# Patient Record
Sex: Female | Born: 1995 | Race: White | Hispanic: No | Marital: Single | Smoking: Never smoker
Health system: Southern US, Community
[De-identification: ages and names within clinical notes are randomized; demographics above are authoritative.]

## PROBLEM LIST (undated history)

## (undated) HISTORY — PX: KNEE SURGERY: SHX244

## (undated) HISTORY — PX: ANTERIOR CRUCIATE LIGAMENT REPAIR: SHX115

---

## 2017-09-28 ENCOUNTER — Emergency Department (HOSPITAL_BASED_OUTPATIENT_CLINIC_OR_DEPARTMENT_OTHER): Payer: Managed Care, Other (non HMO)

## 2017-09-28 ENCOUNTER — Encounter (HOSPITAL_BASED_OUTPATIENT_CLINIC_OR_DEPARTMENT_OTHER): Payer: Self-pay | Admitting: Emergency Medicine

## 2017-09-28 DIAGNOSIS — Y999 Unspecified external cause status: Secondary | ICD-10-CM | POA: Diagnosis not present

## 2017-09-28 DIAGNOSIS — Y929 Unspecified place or not applicable: Secondary | ICD-10-CM | POA: Insufficient documentation

## 2017-09-28 DIAGNOSIS — X509XXA Other and unspecified overexertion or strenuous movements or postures, initial encounter: Secondary | ICD-10-CM | POA: Diagnosis not present

## 2017-09-28 DIAGNOSIS — Y939 Activity, unspecified: Secondary | ICD-10-CM | POA: Insufficient documentation

## 2017-09-28 DIAGNOSIS — S39012A Strain of muscle, fascia and tendon of lower back, initial encounter: Secondary | ICD-10-CM | POA: Insufficient documentation

## 2017-09-28 DIAGNOSIS — S3992XA Unspecified injury of lower back, initial encounter: Secondary | ICD-10-CM | POA: Diagnosis present

## 2017-09-28 LAB — URINALYSIS, ROUTINE W REFLEX MICROSCOPIC
Bilirubin Urine: NEGATIVE
Glucose, UA: NEGATIVE mg/dL
HGB URINE DIPSTICK: NEGATIVE
Ketones, ur: NEGATIVE mg/dL
Leukocytes, UA: NEGATIVE
Nitrite: NEGATIVE
PROTEIN: NEGATIVE mg/dL
Specific Gravity, Urine: <1.005 — ABNORMAL LOW (ref 1.005–1.030)
pH: 6 (ref 5.0–8.0)

## 2017-09-28 LAB — PREGNANCY, URINE: PREG TEST UR: NEGATIVE

## 2017-09-28 NOTE — ED Triage Notes (Signed)
PT presents with c/o right lower back pain and now is moving around to RLQ.

## 2017-09-29 ENCOUNTER — Emergency Department (HOSPITAL_BASED_OUTPATIENT_CLINIC_OR_DEPARTMENT_OTHER)
Admission: EM | Admit: 2017-09-29 | Discharge: 2017-09-29 | Disposition: A | Discharge status: Home / Self Care | Payer: Managed Care, Other (non HMO) | Attending: Physician Assistant | Admitting: Physician Assistant

## 2017-09-29 DIAGNOSIS — S39012A Strain of muscle, fascia and tendon of lower back, initial encounter: Secondary | ICD-10-CM

## 2017-09-29 LAB — COMPREHENSIVE METABOLIC PANEL
ALBUMIN: 3.6 g/dL (ref 3.5–5.0)
ALT: 12 U/L — ABNORMAL LOW (ref 14–54)
AST: 15 U/L (ref 15–41)
Alkaline Phosphatase: 70 U/L (ref 38–126)
Anion gap: 7 (ref 5–15)
BUN: 9 mg/dL (ref 6–20)
CALCIUM: 8.6 mg/dL — AB (ref 8.9–10.3)
CO2: 23 mmol/L (ref 22–32)
Chloride: 106 mmol/L (ref 101–111)
Creatinine, Ser: 0.61 mg/dL (ref 0.44–1.00)
Glucose, Bld: 103 mg/dL — ABNORMAL HIGH (ref 65–99)
Potassium: 3.7 mmol/L (ref 3.5–5.1)
SODIUM: 136 mmol/L (ref 135–145)
Total Bilirubin: 0.5 mg/dL (ref 0.3–1.2)
Total Protein: 6.7 g/dL (ref 6.5–8.1)

## 2017-09-29 LAB — CBC WITH DIFFERENTIAL/PLATELET
BASOS PCT: 0 %
Basophils Absolute: 0 10*3/uL (ref 0.0–0.1)
EOS ABS: 0.2 10*3/uL (ref 0.0–0.7)
Eosinophils Relative: 2 %
HCT: 34.3 % — ABNORMAL LOW (ref 36.0–46.0)
HEMOGLOBIN: 11.7 g/dL — AB (ref 12.0–15.0)
Lymphocytes Relative: 21 %
Lymphs Abs: 1.9 10*3/uL (ref 0.7–4.0)
MCH: 29.9 pg (ref 26.0–34.0)
MCHC: 34.1 g/dL (ref 30.0–36.0)
MCV: 87.7 fL (ref 78.0–100.0)
MONOS PCT: 13 %
Monocytes Absolute: 1.1 10*3/uL — ABNORMAL HIGH (ref 0.1–1.0)
NEUTROS PCT: 64 %
Neutro Abs: 5.8 10*3/uL (ref 1.7–7.7)
PLATELETS: 229 10*3/uL (ref 150–400)
RBC: 3.91 MIL/uL (ref 3.87–5.11)
RDW: 11.5 % (ref 11.5–15.5)
WBC: 9 10*3/uL (ref 4.0–10.5)

## 2017-09-29 MED ORDER — CYCLOBENZAPRINE HCL 5 MG PO TABS: 5.0000 mg | ORAL_TABLET | Freq: Two times a day (BID) | ORAL | 0 refills | Status: DC | PRN

## 2017-09-29 MED ORDER — CYCLOBENZAPRINE HCL 5 MG PO TABS
5.0000 mg | ORAL_TABLET | Freq: Once | ORAL | Status: AC
  Administered 2017-09-29: 5 mg via ORAL
  Filled 2017-09-29: qty 1

## 2017-09-29 MED ORDER — IBUPROFEN 800 MG PO TABS: 800.0000 mg | ORAL_TABLET | Freq: Three times a day (TID) | ORAL | 0 refills | Status: DC

## 2017-09-29 MED ORDER — IBUPROFEN 800 MG PO TABS
800.0000 mg | ORAL_TABLET | Freq: Once | ORAL | Status: AC
  Administered 2017-09-29: 800 mg via ORAL
  Filled 2017-09-29: qty 2

## 2017-09-29 NOTE — Discharge Instructions (Signed)
We unsure what is causing your pain today.  Considering that it is made worse with movement, it is likely that is muscular skeletal in nature.  Muscle spasm versus lumbar sprain.  Your lab work, x-ray, urine are all reassuring.

## 2017-09-29 NOTE — ED Provider Notes (Signed)
[facility] EMERGENCY DEPARTMENT Provider Note   CSN: [id number] Arrival date & time: 09/28/17  2243     History   Chief Complaint Chief Complaint  Patient presents with  . Back Pain    HPI Tamera Reasondrianna Cavanah is a 21 y.o. female.  HPI  Patient is a 21 year old female presenting with right back pain.  Is worse with movement.  Patient is noticed that it is worse when going from sitting to standing or the opposite.  No nausea no vomiting no temperature.  No systemic signs of illness.  No numbness no tingling no weakness.  History reviewed. No pertinent past medical history.  There are no active problems to display for this patient.   Past Surgical History:  Procedure Laterality Date  . ANTERIOR CRUCIATE LIGAMENT REPAIR      OB History    No data available       Home Medications    Prior to Admission medications   Not on File    Family History No family history on file.  Social History Social History  Substance Use Topics  . Smoking status: Never Smoker  . Smokeless tobacco: Never Used  . Alcohol use Yes     Allergies   Patient has no known allergies.   Review of Systems Review of Systems  Constitutional: Negative for activity change.  Respiratory: Negative for shortness of breath.   Cardiovascular: Negative for chest pain.  Gastrointestinal: Negative for abdominal pain.  Musculoskeletal: Positive for back pain.  Neurological: Negative for weakness and numbness.  All other systems reviewed and are negative.    Physical Exam Updated Vital Signs BP 124/79 (BP Location: Left Arm)   Pulse 78   Temp 98.7 F (37.1 C) (Oral)   Resp 20   LMP 09/19/2017   SpO2 99%   Physical Exam  Constitutional: She is oriented to person, place, and time. She appears well-developed and well-nourished.  HENT:  Head: Normocephalic and atraumatic.  Eyes: Right eye exhibits no discharge.  Cardiovascular: Normal rate, regular rhythm and normal heart  sounds.   No murmur heard. Pulmonary/Chest: Effort normal and breath sounds normal. She has no wheezes. She has no rales.  Abdominal: Soft. She exhibits no distension. There is no tenderness.  Musculoskeletal:  Normal strength of bilateral lower extremities.  No numbness no tingling.  No pain along the spine.  The pain is more located over the right flank.  Neurological: She is oriented to person, place, and time.  Skin: Skin is warm and dry. She is not diaphoretic.  Psychiatric: She has a normal mood and affect.  Nursing note and vitals reviewed.    ED Treatments / Results  Labs (all labs ordered are listed, but only abnormal results are displayed) Labs Reviewed  URINALYSIS, ROUTINE W REFLEX MICROSCOPIC - Abnormal; Notable for the following:       Result Value   Color, Urine COLORLESS (*)    Specific Gravity, Urine <1.005 (*)    All other components within normal limits  PREGNANCY, URINE  COMPREHENSIVE METABOLIC PANEL  CBC WITH DIFFERENTIAL/PLATELET    EKG  EKG Interpretation None       Radiology Dg Lumbar Spine Complete  Result Date: 09/28/2017 CLINICAL DATA:  Acute onset of right lower back pain. Initial encounter. EXAM: LUMBAR SPINE - COMPLETE 4+ VIEW COMPARISON:  None. FINDINGS: There is no evidence of fracture or subluxation. Vertebral bodies demonstrate normal height and alignment. Intervertebral disc spaces are preserved. The visualized neural foramina are grossly  unremarkable in appearance. The visualized bowel gas pattern is unremarkable in appearance; air and stool are noted within the colon. The sacroiliac joints are within normal limits. IMPRESSION: No evidence of fracture or subluxation along the lumbar spine. Electronically Signed   By: Roanna Raider M.D.   On: 09/28/2017 23:46    Procedures Procedures (including critical care time)  Medications Ordered in ED Medications - No data to display   Initial Impression / Assessment and Plan / ED Course  I  have reviewed the triage vital signs and the nursing notes.  Pertinent labs & imaging results that were available during my care of the patient were reviewed by me and considered in my medical decision making (see chart for details).     Patient is a 20 year old female presenting with right back pain.  Is worse with movement.  Patient is noticed that it is worse when going from sitting to standing or the opposite.  No nausea no vomiting no temperature.  No systemic signs of illness.  No numbness no tingling no weakness.   3:06 AM Pt is here with what sounds like mechanical muscularskeletal back pain.  However patient's mom was very concerned.  We will do a baseline lab work to make sure patient's current kidney function is okay. Otherwise urine is negative and x-ray is negative and is worse with movement which sounds muscular skeletal.  Labs, xray and vitals all reassuring. Pt ambulatory and appears well. Will discharge with return precuations.   Final Clinical Impressions(s) / ED Diagnoses   Final diagnoses:  None    New Prescriptions New Prescriptions   No medications on file     Abelino Derrick, MD 09/29/17 [id number]

## 2017-10-01 ENCOUNTER — Emergency Department (HOSPITAL_BASED_OUTPATIENT_CLINIC_OR_DEPARTMENT_OTHER)
Admission: EM | Admit: 2017-10-01 | Discharge: 2017-10-01 | Disposition: A | Discharge status: Home / Self Care | Payer: Managed Care, Other (non HMO) | Attending: Emergency Medicine | Admitting: Emergency Medicine

## 2017-10-01 ENCOUNTER — Emergency Department (HOSPITAL_BASED_OUTPATIENT_CLINIC_OR_DEPARTMENT_OTHER): Payer: Managed Care, Other (non HMO)

## 2017-10-01 ENCOUNTER — Encounter (HOSPITAL_BASED_OUTPATIENT_CLINIC_OR_DEPARTMENT_OTHER): Payer: Self-pay | Admitting: Emergency Medicine

## 2017-10-01 DIAGNOSIS — R109 Unspecified abdominal pain: Secondary | ICD-10-CM

## 2017-10-01 DIAGNOSIS — R1031 Right lower quadrant pain: Secondary | ICD-10-CM | POA: Diagnosis not present

## 2017-10-01 DIAGNOSIS — M545 Low back pain: Secondary | ICD-10-CM | POA: Insufficient documentation

## 2017-10-01 LAB — WET PREP, GENITAL
Clue Cells Wet Prep HPF POC: NONE SEEN
SPERM: NONE SEEN
TRICH WET PREP: NONE SEEN
YEAST WET PREP: NONE SEEN

## 2017-10-01 MED ORDER — ORPHENADRINE CITRATE ER 100 MG PO TB12: 100.0000 mg | ORAL_TABLET | Freq: Two times a day (BID) | ORAL | 0 refills | Status: DC

## 2017-10-01 MED ORDER — METHOCARBAMOL 500 MG PO TABS
500.0000 mg | ORAL_TABLET | Freq: Once | ORAL | Status: AC
  Administered 2017-10-01: 500 mg via ORAL

## 2017-10-01 MED ORDER — IBUPROFEN 800 MG PO TABS: 800.0000 mg | ORAL_TABLET | Freq: Three times a day (TID) | ORAL | 0 refills | Status: DC

## 2017-10-01 MED ORDER — METHOCARBAMOL 1000 MG/10ML IJ SOLN: 500.0000 mg | Freq: Once | INTRAMUSCULAR | Status: DC

## 2017-10-01 MED ORDER — KETOROLAC TROMETHAMINE 60 MG/2ML IM SOLN
60.0000 mg | Freq: Once | INTRAMUSCULAR | Status: AC
  Administered 2017-10-01: 60 mg via INTRAMUSCULAR
  Filled 2017-10-01: qty 2

## 2017-10-01 MED ORDER — METHOCARBAMOL 500 MG PO TABS
ORAL_TABLET | ORAL | Status: AC
  Filled 2017-10-01: qty 1

## 2017-10-01 NOTE — Discharge Instructions (Signed)
1.  Take ibuprofen every 8 hours with food for the next 3 days. 2.  Take Norflex twice a day. 3.  Schedule follow-up within the next 3-5 days.

## 2017-10-01 NOTE — ED Notes (Signed)
ED Provider at bedside. 

## 2017-10-01 NOTE — ED Triage Notes (Signed)
R lower back pain radiating to abd x 10 days. Seen Saturday for same. Pain persists.

## 2017-10-01 NOTE — ED Provider Notes (Signed)
[facility] EMERGENCY DEPARTMENT Provider Note   CSN: [id number] Arrival date & time: 10/01/17  1819     History   Chief Complaint Chief Complaint  Patient presents with  . Back Pain    HPI Summer Kim is a 21 y.o. female.  HPI Patient has had patient has had right lower back pain for approximately a week.  Has been constant.  Worsened significantly over the past 2 days.  Now patient has flank pain as well as right lower abdominal pain.  Patient reports she did have some burning with urination about 2 weeks ago but did not seek treatment because she was on a cruise at that time.  She denies any abnormal vaginal bleeding or discharge.  Patient is sexually active and uses birth control. History reviewed. No pertinent past medical history.  There are no active problems to display for this patient.   Past Surgical History:  Procedure Laterality Date  . ANTERIOR CRUCIATE LIGAMENT REPAIR      OB History    No data available       Home Medications    Prior to Admission medications   Medication Sig Start Date End Date Taking? Authorizing Provider  cyclobenzaprine (FLEXERIL) 5 MG tablet Take 1 tablet (5 mg total) by mouth 2 (two) times daily as needed for muscle spasms. 09/29/17   Mackuen, Courteney Lyn, MD  ibuprofen (ADVIL,MOTRIN) 800 MG tablet Take 1 tablet (800 mg total) by mouth 3 (three) times daily. 09/29/17   Mackuen, Courteney Lyn, MD  ibuprofen (ADVIL,MOTRIN) 800 MG tablet Take 1 tablet (800 mg total) by mouth 3 (three) times daily. 10/01/17   Arby Barrette, MD  orphenadrine (NORFLEX) 100 MG tablet Take 1 tablet (100 mg total) by mouth 2 (two) times daily. 10/01/17   Arby Barrette, MD    Family History No family history on file.  Social History Social History  Substance Use Topics  . Smoking status: Never Smoker  . Smokeless tobacco: Never Used  . Alcohol use Yes     Allergies   Patient has no known allergies.   Review of  Systems Review of Systems 10 Systems reviewed and are negative for acute change except as noted in the HPI.   Physical Exam Updated Vital Signs BP 128/73 (BP Location: Right Arm)   Pulse (!) 107   Temp 99.6 F (37.6 C) (Oral)   Resp 20   LMP 09/19/2017 Comment: neg upreg  SpO2 100%   Physical Exam  Constitutional: She is oriented to person, place, and time. She appears well-developed and well-nourished.  Patient is alert and clinically well in appearance.  She is tearful due to pain.  No respiratory distress.  HENT:  Head: Normocephalic and atraumatic.  Eyes: Conjunctivae and EOM are normal.  Neck: Neck supple.  Cardiovascular: Normal rate, regular rhythm and normal heart sounds.   No murmur heard. Pulmonary/Chest: Effort normal and breath sounds normal. No respiratory distress.  Abdominal: Soft. There is tenderness.  Right lower and lateral quadrant tender to palpation.  No palpable anomaly.  Patient endorses tenderness as well over the paraspinous and lateral flank on the right.  No rashes and no soft tissue anomaly.  Genitourinary:  Genitourinary Comments: Normal external female genitalia.  Moderate amount of whitish discharge in the vaginal vault.  Cervix is not friable.  No cervical motion tenderness.  No uterine tenderness.  No adnexal tenderness on the left or the right.  Musculoskeletal: Normal range of motion. She exhibits no edema, tenderness  or deformity.  No peripheral edema.  Calves are soft and nontender.  Patient can use lower extremities with excellent strength for positioning.  She can move down the stretcher to position for pelvic examination with both flexion and extension intact bilateral lower extremities.  Neurological: She is alert and oriented to person, place, and time. No cranial nerve deficit. She exhibits normal muscle tone. Coordination normal.  Skin: Skin is warm and dry.  Psychiatric: She has a normal mood and affect.  Nursing note and vitals  reviewed.    ED Treatments / Results  Labs (all labs ordered are listed, but only abnormal results are displayed) Labs Reviewed  WET PREP, GENITAL - Abnormal; Notable for the following:       Result Value   WBC, Wet Prep HPF POC MANY (*)    All other components within normal limits  GC/CHLAMYDIA PROBE AMP ([place]) NOT AT [facility]    EKG  EKG Interpretation None       Radiology Ct Renal Stone Study  Result Date: 10/01/2017 CLINICAL DATA:  21 year old female with right flank pain radiating to the back. EXAM: CT ABDOMEN AND PELVIS WITHOUT CONTRAST TECHNIQUE: Multidetector CT imaging of the abdomen and pelvis was performed following the standard protocol without IV contrast. COMPARISON:  Lumbar spine radiograph dated 09/28/2017 FINDINGS: Evaluation of this exam is limited in the absence of intravenous contrast. Lower chest: The visualized lung bases are clear. No intra-abdominal free air.  No free fluid. Hepatobiliary: There is mild fatty infiltration of the liver. No intrahepatic biliary ductal dilatation. The gallbladder is unremarkable. Pancreas: Unremarkable. No pancreatic ductal dilatation or surrounding inflammatory changes. Spleen: Normal in size without focal abnormality. Adrenals/Urinary Tract: Adrenal glands are unremarkable. Kidneys are normal, without renal calculi, focal lesion, or hydronephrosis. Bladder is unremarkable. Stomach/Bowel: There is moderate stool throughout the colon. There is no evidence of bowel obstruction or active inflammation. The appendix is normal. Vascular/Lymphatic: The abdominal aorta and IVC are grossly unremarkable on this noncontrast CT. No portal venous gas. There is no adenopathy. Reproductive: The uterus is anteverted and grossly unremarkable. The ovaries appear grossly unremarkable as well. No pelvic mass. Other: None Musculoskeletal: No acute or significant osseous findings. IMPRESSION: No acute intra-abdominopelvic pathology. No hydronephrosis or  nephrolithiasis. Electronically Signed   By: Elgie Collard M.D.   On: 10/01/2017 21:02    Procedures Procedures (including critical care time)  Medications Ordered in ED Medications  methocarbamol (ROBAXIN) injection 500 mg (500 mg Intramuscular Not Given 10/01/17 2015)  ketorolac (TORADOL) injection 60 mg (60 mg Intramuscular Given 10/01/17 2010)  methocarbamol (ROBAXIN) tablet 500 mg (500 mg Oral Given 10/01/17 2013)     Initial Impression / Assessment and Plan / ED Course  I have reviewed the triage vital signs and the nursing notes.  Pertinent labs & imaging results that were available during my care of the patient were reviewed by me and considered in my medical decision making (see chart for details).      Final Clinical Impressions(s) / ED Diagnoses   Final diagnoses:  Flank pain   Patient presented with significant exacerbation of ongoing lower right back and flank pain for a weeks duration.  Consideration was given to kidney stone or pelvic etiology.  CT shows no anomaly.  Appendix is visualized and normal.  No kidney stones are present.  Pelvic examination is unremarkable without significant adnexal tenderness.  After patient got Toradol and Robaxin, pain was significantly alleviated.  At that time she was calm  and in no distress.  Able to perform all range of motion without difficulty.  Repeat examination localized residual tenderness from the anterior right lower quadrant along the upper crest of the iliac into the flank.  This seems significantly musculoskeletal.  Patient is counseled for 800 mg ibuprofen 3 times a day and Norflex twice daily.  She is counseled to follow-up this week for recheck. New Prescriptions New Prescriptions   IBUPROFEN (ADVIL,MOTRIN) 800 MG TABLET    Take 1 tablet (800 mg total) by mouth 3 (three) times daily.   ORPHENADRINE (NORFLEX) 100 MG TABLET    Take 1 tablet (100 mg total) by mouth 2 (two) times daily.     Arby BarrettePfeiffer, Jeoffrey Eleazer, MD 10/01/17  2151

## 2017-10-03 LAB — GC/CHLAMYDIA PROBE AMP (~~LOC~~) NOT AT ARMC
CHLAMYDIA, DNA PROBE: NEGATIVE
Neisseria Gonorrhea: NEGATIVE

## 2018-02-17 ENCOUNTER — Encounter (HOSPITAL_BASED_OUTPATIENT_CLINIC_OR_DEPARTMENT_OTHER): Payer: Self-pay

## 2018-02-17 ENCOUNTER — Emergency Department (HOSPITAL_BASED_OUTPATIENT_CLINIC_OR_DEPARTMENT_OTHER)
Admission: EM | Admit: 2018-02-17 | Discharge: 2018-02-17 | Disposition: A | Discharge status: Home / Self Care | Payer: Managed Care, Other (non HMO) | Attending: Emergency Medicine | Admitting: Emergency Medicine

## 2018-02-17 ENCOUNTER — Emergency Department (HOSPITAL_BASED_OUTPATIENT_CLINIC_OR_DEPARTMENT_OTHER): Payer: Managed Care, Other (non HMO)

## 2018-02-17 ENCOUNTER — Other Ambulatory Visit: Payer: Self-pay

## 2018-02-17 DIAGNOSIS — B9789 Other viral agents as the cause of diseases classified elsewhere: Secondary | ICD-10-CM | POA: Diagnosis not present

## 2018-02-17 DIAGNOSIS — J069 Acute upper respiratory infection, unspecified: Secondary | ICD-10-CM

## 2018-02-17 DIAGNOSIS — R05 Cough: Secondary | ICD-10-CM | POA: Diagnosis present

## 2018-02-17 MED ORDER — BENZONATATE 100 MG PO CAPS: 100.0000 mg | ORAL_CAPSULE | Freq: Three times a day (TID) | ORAL | 0 refills | Status: AC

## 2018-02-17 MED ORDER — PREDNISONE 20 MG PO TABS
40.0000 mg | ORAL_TABLET | Freq: Once | ORAL | Status: AC
  Administered 2018-02-17: 40 mg via ORAL
  Filled 2018-02-17: qty 2

## 2018-02-17 MED ORDER — ALBUTEROL SULFATE HFA 108 (90 BASE) MCG/ACT IN AERS: 1.0000 | INHALATION_SPRAY | Freq: Four times a day (QID) | RESPIRATORY_TRACT | 0 refills | Status: AC | PRN

## 2018-02-17 MED ORDER — IPRATROPIUM-ALBUTEROL 0.5-2.5 (3) MG/3ML IN SOLN
3.0000 mL | Freq: Once | RESPIRATORY_TRACT | Status: AC
Start: 2018-02-17 — End: 2018-02-17
  Administered 2018-02-17: 3 mL via RESPIRATORY_TRACT
  Filled 2018-02-17: qty 3

## 2018-02-17 MED ORDER — PREDNISONE 20 MG PO TABS: 40.0000 mg | ORAL_TABLET | Freq: Every day | ORAL | 0 refills | Status: AC

## 2018-02-17 NOTE — ED Notes (Signed)
Pt verbalizes understanding of d/c instructions and denies any further needs at this time. 

## 2018-02-17 NOTE — ED Triage Notes (Addendum)
C/o flu like sx x 4 days-NAD-steady gait-pt states she was seen at medical clinic on [employer] [facility]-unsure of dx except flu neg-was given "zpack"

## 2018-02-17 NOTE — Discharge Instructions (Signed)
Your chest x-ray showed no signs of pneumonia.  This is likely a viral illness since she started her Z-Pak would continue this.  But also use the albuterol inhaler for any wheezing.  Continue the steroid starting tomorrow for the next 3 days to complete a 4-day course.  Use the Tessalon for cough.  Drink plenty of fluids.  Take over-the-counter decongestion such as Mucinex and follow-up with a primary care doctor return the ED with any worsening symptoms.

## 2018-02-17 NOTE — ED Notes (Signed)
[facility] saw [facility] this morning, received a z-pack and prescriptions, did not get prescriptions filled

## 2018-02-18 NOTE — ED Provider Notes (Signed)
[facility] EMERGENCY DEPARTMENT Provider Note   CSN: [id number] Arrival date & time: 02/17/18  1710     History   Chief Complaint Chief Complaint  Patient presents with  . Cough    HPI Summer Kim is a 22 y.o. female.  HPI 22 year old Caucasian female with no pertinent past medical history presents to the emergency department today with complaints of productive cough, rhinorrhea, sore throat, chills.  Patient states that her symptoms started approximately 4 days ago.  States at first it was rhinorrhea that then progressed to postnasal drip with sore throat and now she is having a productive cough.  States that she was seen by her medical clinic [facility] today and was diagnosed with a viral illness.  She was given cough medications, azithromycin, nasal spray.  Patient has picked up the antibiotic that she did not pick up any other medications.  States that her symptoms have progressed since seeing them this morning.  She reports wheezing now with associated worsening productive cough.  Patient reports history of bronchitis and states this feels very similar.  Patient denies getting influenza vaccine this year.  She states that she recently returned from [place] trip to the [place].  Patient denies any known fevers but does report subjective fevers and chills.  States that she was flu negative at the campus health clinic today.  Patient denies any associated shortness of breath, chest pain, nausea, vomiting, diarrhea, otalgia. History reviewed. No pertinent past medical history.  There are no active problems to display for this patient.   Past Surgical History:  Procedure Laterality Date  . ANTERIOR CRUCIATE LIGAMENT REPAIR    . KNEE SURGERY      OB History    No data available       Home Medications    Prior to Admission medications   Medication Sig Start Date End Date Taking? Authorizing Provider  albuterol (PROVENTIL HFA;VENTOLIN HFA) 108 (90 Base)  MCG/ACT inhaler Inhale 1-2 puffs into the lungs every 6 (six) hours as needed for wheezing or shortness of breath. 02/17/18   Rise MuLeaphart, Tanique Matney T, PA-C  benzonatate (TESSALON) 100 MG capsule Take 1 capsule (100 mg total) by mouth every 8 (eight) hours. 02/17/18   Rise MuLeaphart, Zannah Melucci T, PA-C  predniSONE (DELTASONE) 20 MG tablet Take 2 tablets (40 mg total) by mouth daily with breakfast. 02/17/18   Rise MuLeaphart, Kenesha Moshier T, PA-C    Family History No family history on file.  Social History Social History   Tobacco Use  . Smoking status: Never Smoker  . Smokeless tobacco: Never Used  Substance Use Topics  . Alcohol use: Yes    Comment: weekly  . Drug use: No     Allergies   Patient has no known allergies.   Review of Systems Review of Systems  All other systems reviewed and are negative.    Physical Exam Updated Vital Signs BP (!) 134/94 (BP Location: Right Arm)   Pulse 97   Temp 99.1 F (37.3 C) (Oral)   Resp 20   Ht 5\' 11"  (1.803 m)   Wt 77.1 kg (170 lb)   LMP 01/27/2018   SpO2 99%   BMI 23.71 kg/m   Physical Exam  Constitutional: She appears well-developed and well-nourished. No distress.  HENT:  Head: Normocephalic and atraumatic.  Right Ear: Tympanic membrane, external ear and ear canal normal.  Left Ear: Tympanic membrane, external ear and ear canal normal.  Nose: Mucosal edema and rhinorrhea present.  Mouth/Throat:  Uvula is midline and mucous membranes are normal. No trismus in the jaw. No uvula swelling. Posterior oropharyngeal erythema present. No oropharyngeal exudate, posterior oropharyngeal edema or tonsillar abscesses. Tonsils are 1+ on the right. Tonsils are 1+ on the left. No tonsillar exudate.  Eyes: Right eye exhibits no discharge. Left eye exhibits no discharge. No scleral icterus.  Neck: Normal range of motion. Neck supple.  Cardiovascular: Normal rate, regular rhythm, normal heart sounds and intact distal pulses.  Pulmonary/Chest: Effort normal. No  stridor. No respiratory distress. She has wheezes (mild end expiratory wheeze). She has no rales. She exhibits no tenderness.  Musculoskeletal: Normal range of motion.  Lymphadenopathy:    She has no cervical adenopathy.  Neurological: She is alert.  Skin: Skin is warm and dry. Capillary refill takes less than 2 seconds. No pallor.  Psychiatric: Her behavior is normal. Judgment and thought content normal.  Nursing note and vitals reviewed.    ED Treatments / Results  Labs (all labs ordered are listed, but only abnormal results are displayed) Labs Reviewed - No data to display  EKG  EKG Interpretation None       Radiology Dg Chest 2 View  Result Date: 02/17/2018 CLINICAL DATA:  Cough, congestion and wheeze x4 days. EXAM: CHEST - 2 VIEW COMPARISON:  None. FINDINGS: The heart size and mediastinal contours are within normal limits. Both lungs are clear. The visualized skeletal structures are unremarkable. IMPRESSION: No active cardiopulmonary disease. Electronically Signed   By: Tollie Eth M.D.   On: 02/17/2018 18:37    Procedures Procedures (including critical care time)  Medications Ordered in ED Medications  ipratropium-albuterol (DUONEB) 0.5-2.5 (3) MG/3ML nebulizer solution 3 mL (3 mLs Nebulization Given 02/17/18 1834)  predniSONE (DELTASONE) tablet 40 mg (40 mg Oral Given 02/17/18 1830)     Initial Impression / Assessment and Plan / ED Course  I have reviewed the triage vital signs and the nursing notes.  Pertinent labs & imaging results that were available during my care of the patient were reviewed by me and considered in my medical decision making (see chart for details).     Pt CXR negative for acute infiltrate as reviewed by myself.  She does have some and extra Tory wheezes on exam.  Patient given DuoNeb with significant improvement in lung sounds the patient feels much improved.   patients symptoms are consistent with URI, likely viral etiology.  Likely viral  bronchitis.  However patient was given antibiotics by her campus health clinic and I encouraged her to continue taking antibiotics since he started.  We will give her steroid pack along with albuterol inhaler and cough medicine.  Pt will be discharged with symptomatic treatment.  Verbalizes understanding and is agreeable with plan. Pt is hemodynamically stable & in NAD prior to dc.  Pt is hemodynamically stable, in NAD, & able to ambulate in the ED. Evaluation does not show pathology that would require ongoing emergent intervention or inpatient treatment. I explained the diagnosis to the patient. Pain has been managed & has no complaints prior to dc. Pt is comfortable with above plan and is stable for discharge at this time. All questions were answered prior to disposition. Strict return precautions for f/u to the ED were discussed. Encouraged follow up with PCP.    Final Clinical Impressions(s) / ED Diagnoses   Final diagnoses:  Viral URI with cough    ED Discharge Orders        Ordered    albuterol (PROVENTIL HFA;VENTOLIN  HFA) 108 (90 Base) MCG/ACT inhaler  Every 6 hours PRN     02/17/18 1900    predniSONE (DELTASONE) 20 MG tablet  Daily with breakfast     02/17/18 1900    benzonatate (TESSALON) 100 MG capsule  Every 8 hours     02/17/18 1900       Rise Mu, PA-C 02/18/18 1003    Doug Sou, MD 02/19/18 [id number]

## 2019-03-27 IMAGING — CT CT RENAL STONE PROTOCOL
2 of 4 series · 16 of 46 positions shown, 18 images · non-contrast
Comparison: Lumbar spine radiograph dated 09/28/2017

CLINICAL DATA: 21-year-old female with right flank pain radiating
to the back.

EXAM:
CT ABDOMEN AND PELVIS WITHOUT CONTRAST
TECHNIQUE: Multidetector CT imaging of the abdomen and pelvis was performed
following the standard protocol without IV contrast.

[Series 2: axial st · axial · 0.77mm/px · z∈[+788,+1258]mm · 13 of 104 slices shown, 15 images]
[im 5/104  soft-tissue]
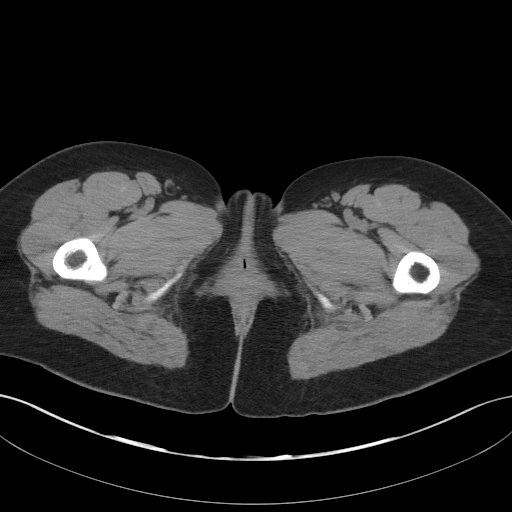
[im 5/104  bone]
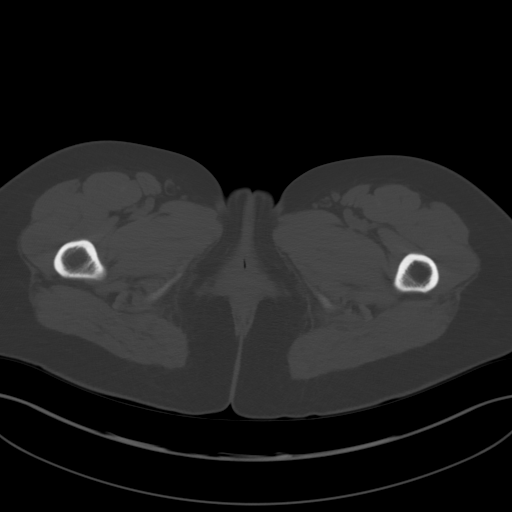
[im 13/104  soft-tissue]
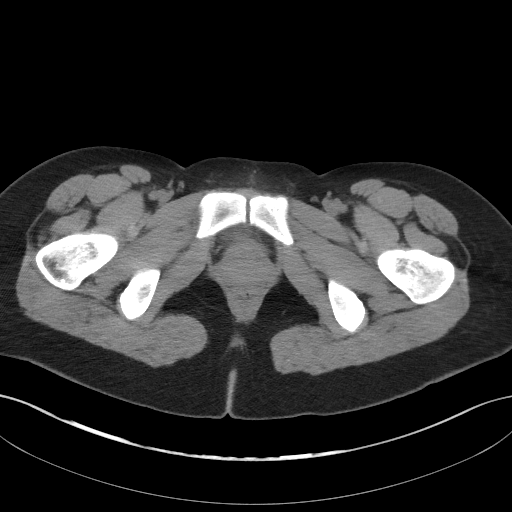
[im 21/104  soft-tissue]
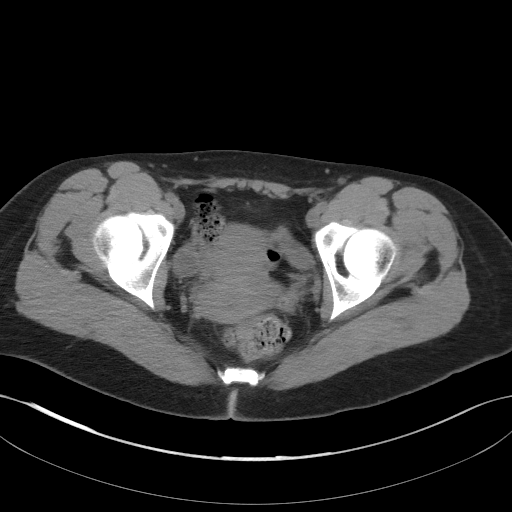
[im 29/104  soft-tissue]
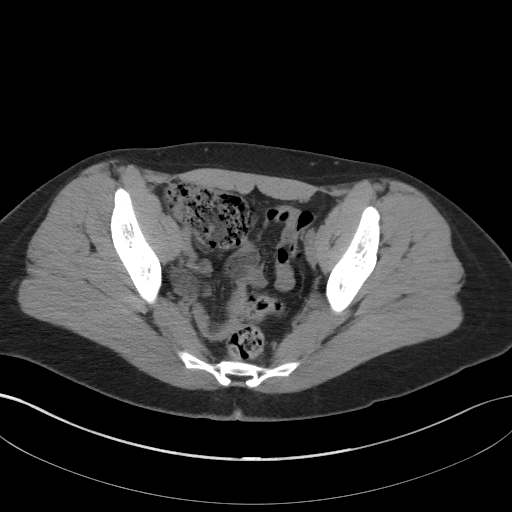
[im 38/104  soft-tissue]
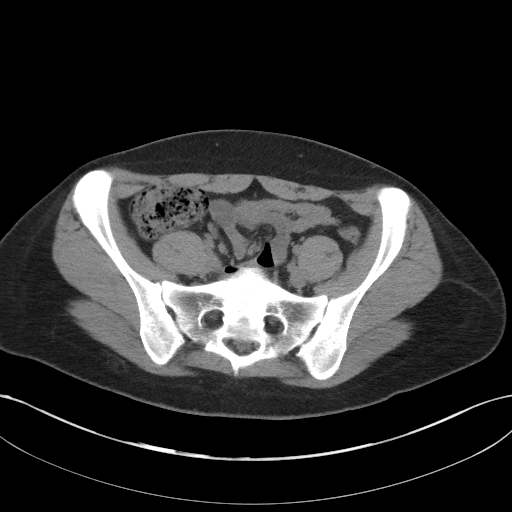
[im 46/104  soft-tissue]
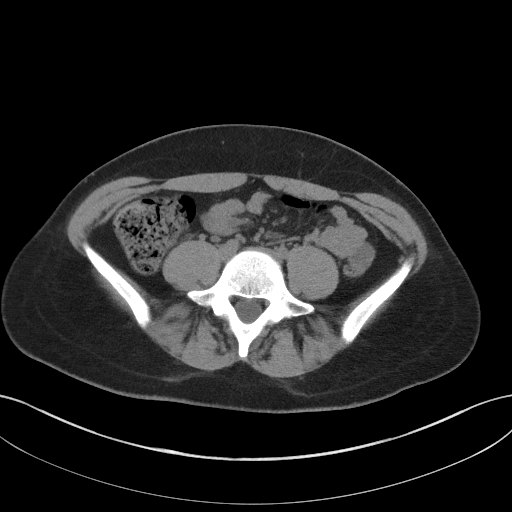
[im 54/104  soft-tissue]
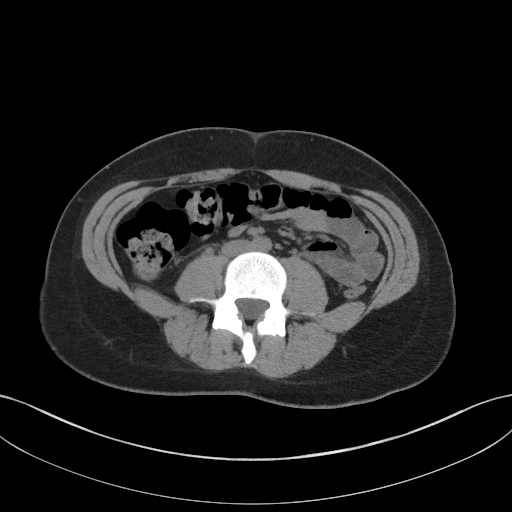
[im 58/104  soft-tissue]
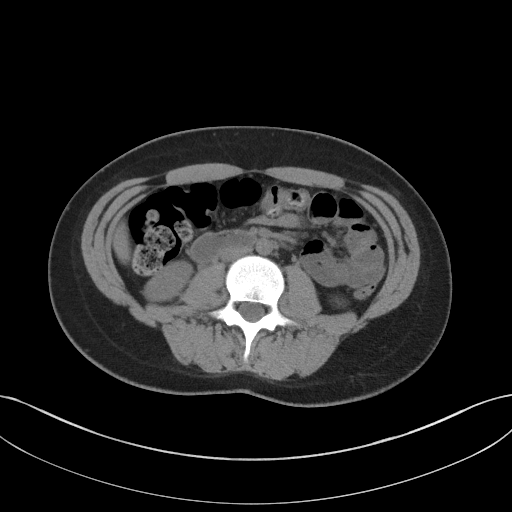
[im 66/104  soft-tissue]
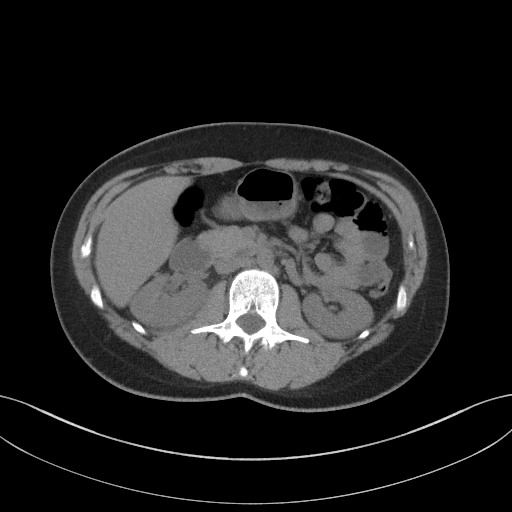
[im 66/104  bone]
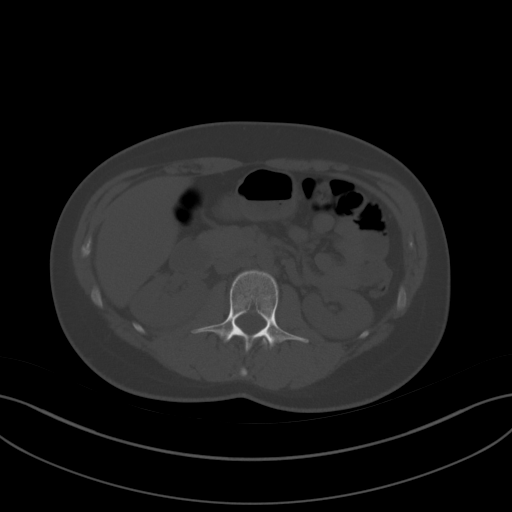
[im 75/104  soft-tissue]
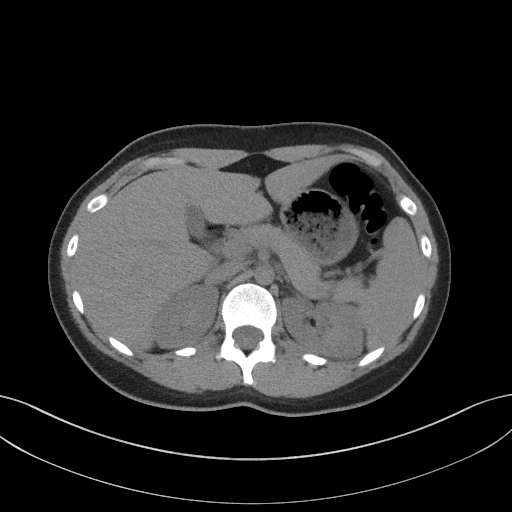
[im 83/104  soft-tissue]
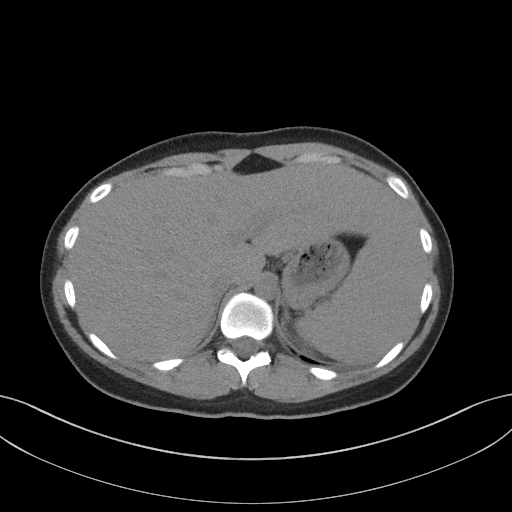
[im 91/104  soft-tissue]
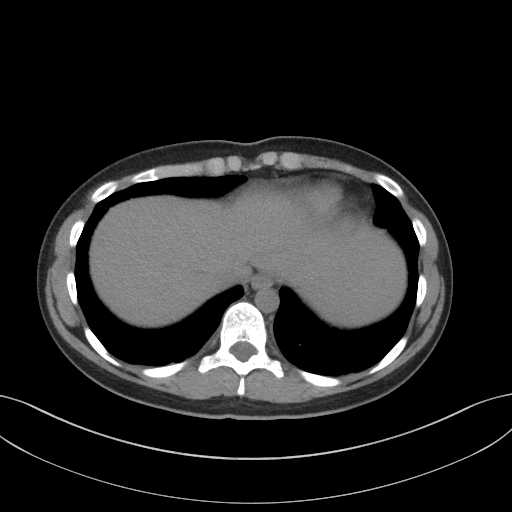
[im 99/104  soft-tissue]
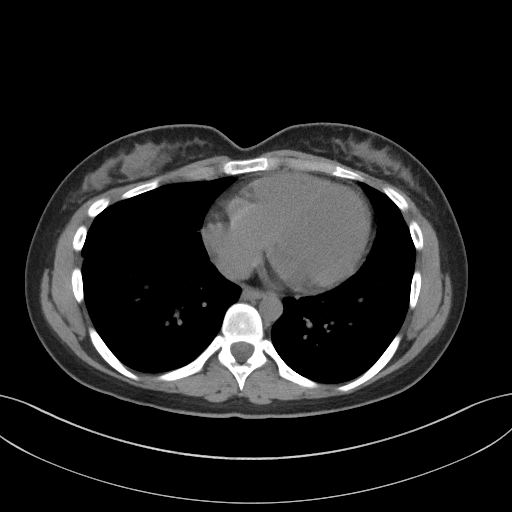

[Series 5: coronal st · coronal · 0.87mm/px · 3 of 82 slices shown]
[im 28/82  soft-tissue]
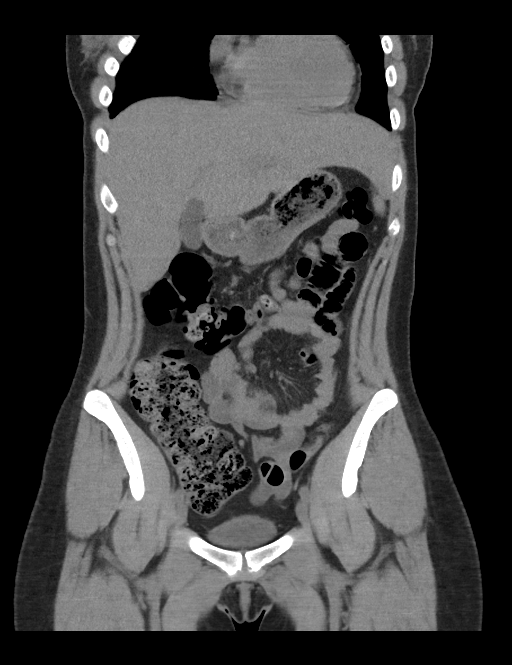
[im 37/82  soft-tissue]
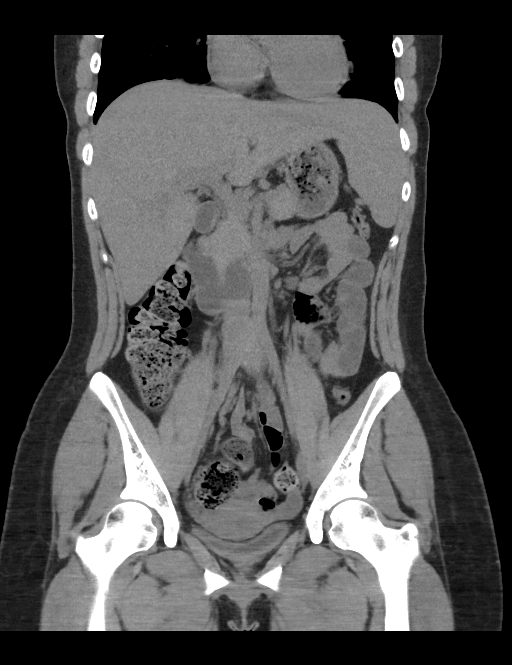
[im 46/82  soft-tissue]
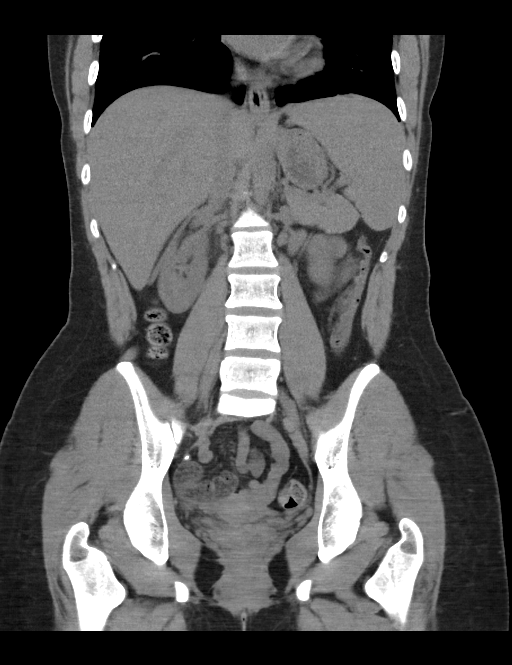

[16 of 46 positions shown; findings below may reference images not displayed]

FINDINGS: Evaluation of this exam is limited in the absence of intravenous
contrast.

Lower chest: The visualized lung bases are clear.

No intra-abdominal free air.  No free fluid.

Hepatobiliary: There is mild fatty infiltration of the liver. No
intrahepatic biliary ductal dilatation. The gallbladder is
unremarkable.

Pancreas: Unremarkable. No pancreatic ductal dilatation or
surrounding inflammatory changes.

Spleen: Normal in size without focal abnormality.

Adrenals/Urinary Tract: Adrenal glands are unremarkable. Kidneys are
normal, without renal calculi, focal lesion, or hydronephrosis.
Bladder is unremarkable.

Stomach/Bowel: There is moderate stool throughout the colon. There
is no evidence of bowel obstruction or active inflammation. The
appendix is normal.

Vascular/Lymphatic: The abdominal aorta and IVC are grossly
unremarkable on this noncontrast CT. No portal venous gas. There is
no adenopathy.

Reproductive: The uterus is anteverted and grossly unremarkable. The
ovaries appear grossly unremarkable as well. No pelvic mass.

Other: None

Musculoskeletal: No acute or significant osseous findings.
IMPRESSION: No acute intra-abdominopelvic pathology. No hydronephrosis or
nephrolithiasis.

## 2019-08-13 IMAGING — CR DG CHEST 2V
2 series · 2 of 2 positions shown · non-contrast
Comparison: None.

CLINICAL DATA: Cough, congestion and wheeze x4 days.

EXAM:
CHEST - 2 VIEW

[w chest pa]
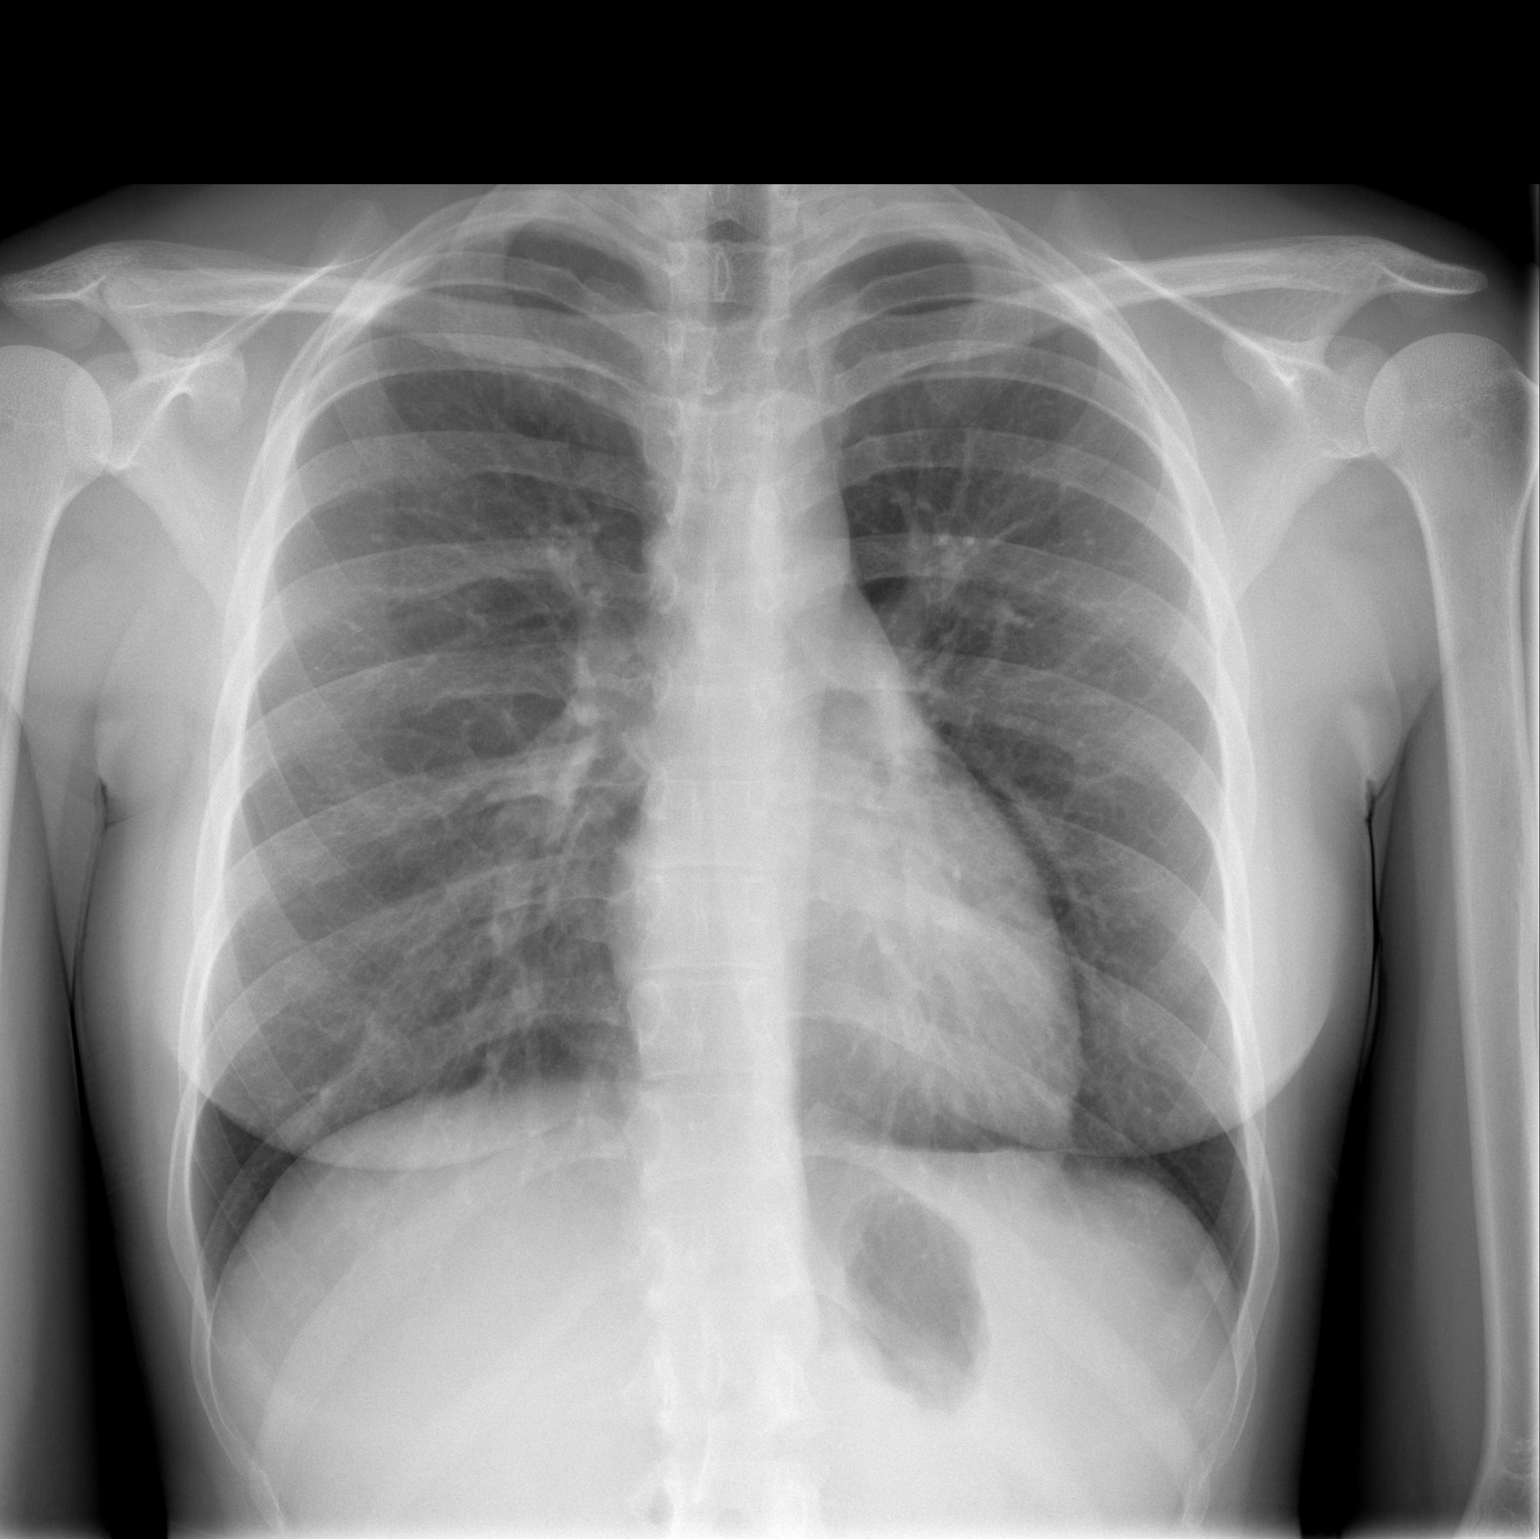

[w chest lat]
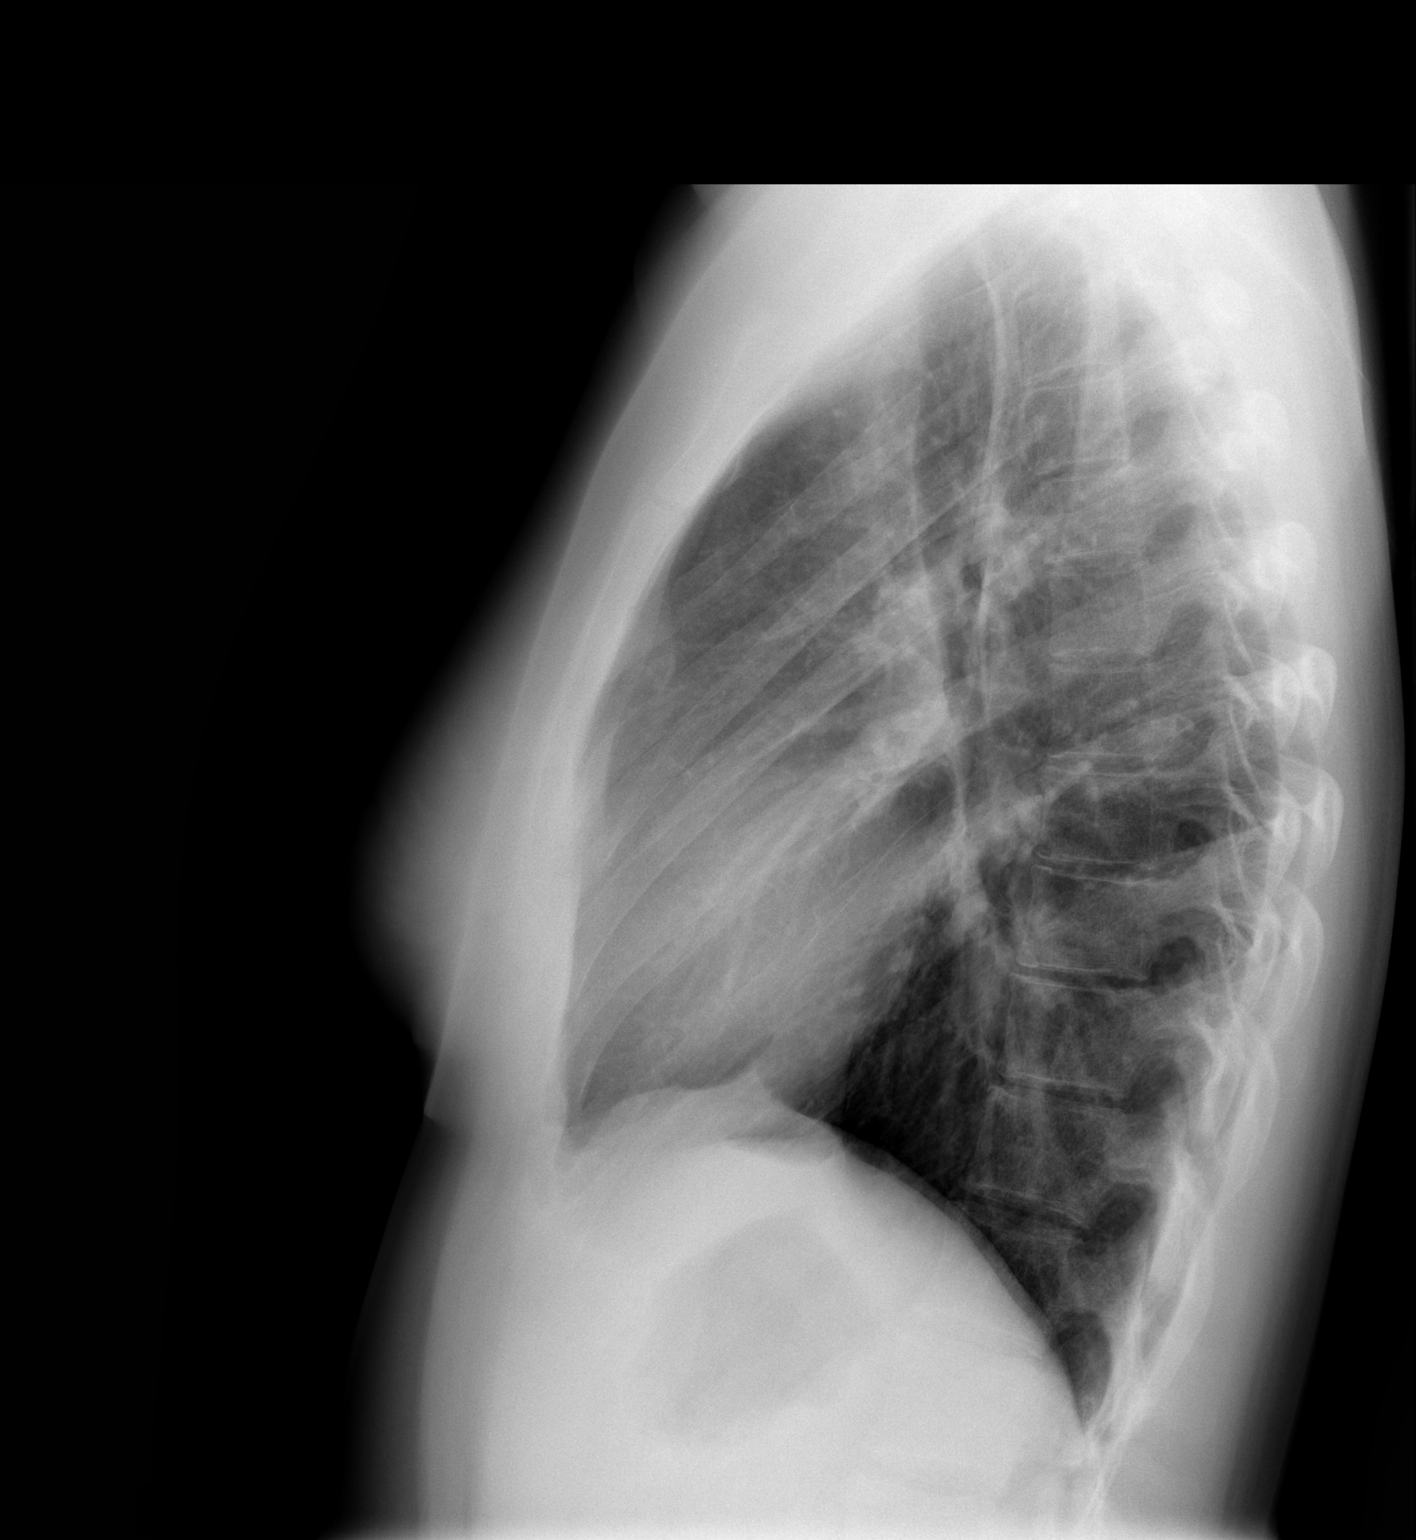

[2 of 2 positions shown; findings below may reference images not displayed]

FINDINGS: The heart size and mediastinal contours are within normal limits.
Both lungs are clear. The visualized skeletal structures are
unremarkable.
IMPRESSION: No active cardiopulmonary disease.
